# Patient Record
Sex: Female | Born: 1995 | Race: White | Hispanic: No | Marital: Single | Smoking: Never smoker
Health system: Southern US, Community
[De-identification: ages and names within clinical notes are randomized; demographics above are authoritative.]

## PROBLEM LIST (undated history)

## (undated) DIAGNOSIS — M5430 Sciatica, unspecified side: Secondary | ICD-10-CM

## (undated) DIAGNOSIS — M549 Dorsalgia, unspecified: Secondary | ICD-10-CM

## (undated) HISTORY — PX: WISDOM TOOTH EXTRACTION: SHX21

---
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
## 2016-04-12 ENCOUNTER — Encounter: Payer: Self-pay | Admitting: *Deleted

## 2016-04-12 ENCOUNTER — Emergency Department
Admission: EM | Admit: 2016-04-12 | Discharge: 2016-04-13 | Disposition: A | Discharge status: Home / Self Care | Payer: Managed Care, Other (non HMO) | Attending: Emergency Medicine | Admitting: Emergency Medicine

## 2016-04-12 DIAGNOSIS — M545 Low back pain, unspecified: Secondary | ICD-10-CM

## 2016-04-12 DIAGNOSIS — Z79899 Other long term (current) drug therapy: Secondary | ICD-10-CM | POA: Insufficient documentation

## 2016-04-12 HISTORY — DX: Dorsalgia, unspecified: M54.9

## 2016-04-12 HISTORY — DX: Sciatica, unspecified side: M54.30

## 2016-04-12 NOTE — ED Notes (Signed)
Pt presents to treatment room via wheelchair with c/o pain upper right gluteal region. Pt states was ambulating when felt sudden sharp pain to right glutea and states "made me fall to the floor." Pt reports did not fall hard, tender to touch. Pt has hx of sciatica but states pain is not the same.

## 2016-04-12 NOTE — ED Triage Notes (Signed)
Pt c/o R lower back pain x 2 hrs. Pt states she has hx of sciatica and [facility]. Pt states took ibuprofen 800 mg x 2 hrs ago w/o relief. Pt states she needs something to help her walk w/o pain. Pt states she feels it is a nerve. Pt has not yet taken her gabapentin.

## 2016-04-13 MED ORDER — KETOROLAC TROMETHAMINE 60 MG/2ML IM SOLN
60.0000 mg | Freq: Once | INTRAMUSCULAR | Status: AC
  Administered 2016-04-13: 60 mg via INTRAMUSCULAR
  Filled 2016-04-13: qty 2

## 2016-04-13 MED ORDER — MELOXICAM 15 MG PO TABS: 15.0000 mg | ORAL_TABLET | Freq: Every day | ORAL | 0 refills | Status: AC

## 2016-04-13 MED ORDER — ORPHENADRINE CITRATE 30 MG/ML IJ SOLN
60.0000 mg | Freq: Two times a day (BID) | INTRAMUSCULAR | Status: DC
  Administered 2016-04-13: 60 mg via INTRAMUSCULAR
  Filled 2016-04-13: qty 2

## 2016-04-13 MED ORDER — TRAMADOL HCL 50 MG PO TABS
50.0000 mg | ORAL_TABLET | Freq: Once | ORAL | Status: AC
  Administered 2016-04-13: 50 mg via ORAL
  Filled 2016-04-13: qty 1

## 2016-04-13 MED ORDER — CYCLOBENZAPRINE HCL 5 MG PO TABS: 5.0000 mg | ORAL_TABLET | Freq: Three times a day (TID) | ORAL | 0 refills | Status: AC | PRN

## 2016-04-13 NOTE — ED Provider Notes (Signed)
[facility] Emergency Department Provider Note  ____________________________________________  Time seen: Approximately 12:26 AM  I have reviewed the triage vital signs and the nursing notes.   HISTORY  Chief Complaint Back Pain    HPI Jill Merritt is a 20 y.o. female that presents to the emergency department with one day of low right back pain. Patient says pain is sharp and is localized to the low right back. Patient has no pain in legs. Patient has no pain over vertebrae. Patient denies numbness or tingling in legs, fingers or toes. Patient is having difficulty walking due to pain. Patient denies urinary incontinence. 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 Patient was instructed from neurologist to return to the emergency department if she had urinary incontinence or could not stand on tiptoes. Patient has chronic back pain and sciatica.    Past Medical History:  Diagnosis Date  . Back pain   . Sciatica     There are no active problems to display for this patient.   History reviewed. No pertinent surgical history.  Prior to Admission medications   Medication Sig Start Date End Date Taking? Authorizing Provider  gabapentin (NEURONTIN) 300 MG capsule Take 300 mg by mouth 3 (three) times daily.   Yes Historical Provider, MD  lisdexamfetamine (VYVANSE) 20 MG capsule Take 20 mg by mouth daily.   Yes Historical Provider, MD  cyclobenzaprine (FLEXERIL) 5 MG tablet Take 1 tablet (5 mg total) by mouth 3 (three) times daily as needed for muscle spasms. 04/13/16 04/20/16  Enid DerryAshley Cira Deyoe, PA-C  meloxicam (MOBIC) 15 MG tablet Take 1 tablet (15 mg total) by mouth daily. 04/13/16 04/23/16  Enid DerryAshley Madalyn Legner, PA-C    Allergies Patient has no known allergies.  History reviewed. No pertinent family history.  Social History Social History  Substance Use Topics  . Smoking status: Never Smoker  . Smokeless tobacco: Never Used   . Alcohol use Yes     Review of Systems  Constitutional: No fever/chills Eyes: No visual changes.  ENT: No upper respiratory complaints. Cardiovascular: no chest pain. Respiratory: no cough. No SOB. Gastrointestinal: No abdominal pain.  No nausea, no vomiting.  No diarrhea.  No constipation. Genitourinary: Negative for dysuria. No hematuria Skin: Negative for rash, abrasions, lacerations, ecchymosis. Neurological: Negative for headaches, focal weakness or numbness.  ____________________________________________   PHYSICAL EXAM:  VITAL SIGNS: ED Triage Vitals  Enc Vitals Group     BP 04/12/16 2217 (!) 143/71     Pulse Rate 04/12/16 2217 75     Resp 04/12/16 2217 18     Temp 04/12/16 2217 98.4 F (36.9 C)     Temp Source 04/12/16 2217 Oral     SpO2 04/12/16 2217 100 %     Weight 04/12/16 2217 175 lb (79.4 kg)     Height 04/12/16 2217 5\' 8"  (1.727 m)     Head Circumference --      Peak Flow --      Pain Score 04/12/16 2225 6     Pain Loc --      Pain Edu? --      Excl. in GC? --      Constitutional: Alert and oriented. Well appearing and in no acute distress. Eyes: Conjunctivae are normal. PERRL. EOMI. Head: Atraumatic. ENT:      Ears:       Nose: No congestion/rhinnorhea.      Mouth/Throat: Mucous membranes are moist.  Neck: No  stridor.   Cardiovascular: Normal rate, regular rhythm. Normal S1 and S2.  Good peripheral circulation. Respiratory: Normal respiratory effort without tachypnea or retractions. Lungs CTAB. Good air entry to the bases with no decreased or absent breath sounds. Gastrointestinal: Bowel sounds 4 quadrants. Soft and nontender to palpation. No guarding or rigidity. No palpable masses. No distention. No CVA tenderness. Musculoskeletal: Full range of motion to all extremities. No gross deformities appreciated. Patient able to stand on tiptoes. Patient able to walk with pain over her right lower back. Tenderness over  right SI joint. Neurologic:  Normal speech and language. No gross focal neurologic deficits are appreciated.  Cranial nerves: 2-10 normal as tested. Strength 5/5 in upper and lower extremities Cerebellar: Finger-nose-finger WNL, Heel to shin WNL Sensorimotor: No pronator drift, clonus, sensory loss or abnormal reflexes. No vision deficits noted to confrontation bilaterally.  Speech: No dysarthria or expressive aphasia Skin:  Skin is warm, dry and intact. No rash noted. Psychiatric: Mood and affect are normal. Speech and behavior are normal. Patient exhibits appropriate insight and judgement.   ____________________________________________   LABS (all labs ordered are listed, but only abnormal results are displayed)  Labs Reviewed - No data to display ____________________________________________  EKG   ____________________________________________  RADIOLOGY  No results found.  ____________________________________________    PROCEDURES  Procedure(s) performed:    Procedures    Medications  orphenadrine (NORFLEX) injection 60 mg (not administered)  ketorolac (TORADOL) injection 60 mg (not administered)  traMADol (ULTRAM) tablet 50 mg (not administered)     ____________________________________________   INITIAL IMPRESSION / ASSESSMENT AND PLAN / ED COURSE  Pertinent labs & imaging results that were available during my care of the patient were reviewed by me and considered in my medical decision making (see chart for details).  Review of the [place] CSRS was performed in accordance of the [facility] prior to dispensing any controlled drugs.  Clinical Course     Patient presents to the emergency department with low right back pain for one day. Patient has a history of epidermal lipomatosis. Patient was given Norflex, tramadol, Toradol in ED. No evidence of cauda equina, cauda hematoma, vascular catastrophe, spinal abscess/hematoma, or referred intra-abdominal pain. All patients questions questions were  answered.  Patient will be discharged home with prescriptions for Flexeril and meloxicam. Patient is to follow up with neurology in the morning. Patient is given ED precautions to return to the ED for any worsening or new symptoms.     ____________________________________________  FINAL CLINICAL IMPRESSION(S) / ED DIAGNOSES  Final diagnoses:  Acute right-sided low back pain without sciatica      NEW MEDICATIONS STARTED DURING THIS VISIT:  New Prescriptions   CYCLOBENZAPRINE (FLEXERIL) 5 MG TABLET    Take 1 tablet (5 mg total) by mouth 3 (three) times daily as needed for muscle spasms.   MELOXICAM (MOBIC) 15 MG TABLET    Take 1 tablet (15 mg total) by mouth daily.        This chart was dictated using voice recognition software/Dragon. Despite best efforts to proofread, errors can occur which can change the meaning. Any change was purely unintentional.   Enid DerryAshley Aaden Buckman, PA-C 04/13/16 0033    Rockne MenghiniAnne-Caroline Norman, MD 04/14/16 2119

## 2016-04-13 NOTE — ED Notes (Signed)

## 2016-04-13 NOTE — ED Notes (Signed)
.  [email or web address]

## 2016-04-13 NOTE — ED Notes (Signed)
Pt received instructions on how to ambulate with crutches. Pt states that they have prior knowledge from using crutches in the past. Pt verbalized understanding of instructions and had no further questions. Rn notified

## 2017-03-07 ENCOUNTER — Ambulatory Visit
Admission: EM | Admit: 2017-03-07 | Discharge: 2017-03-07 | Disposition: A | Discharge status: Home / Self Care | Payer: 59 | Source: Ambulatory Visit | Attending: Family Medicine | Admitting: Family Medicine

## 2017-03-07 ENCOUNTER — Ambulatory Visit
Admission: RE | Admit: 2017-03-07 | Discharge: 2017-03-07 | Disposition: A | Discharge status: Home / Self Care | Payer: 59 | Source: Ambulatory Visit | Attending: Family Medicine | Admitting: Family Medicine

## 2017-03-07 ENCOUNTER — Other Ambulatory Visit: Payer: Self-pay | Admitting: Family Medicine

## 2017-03-07 DIAGNOSIS — R05 Cough: Secondary | ICD-10-CM

## 2017-03-07 DIAGNOSIS — R059 Cough, unspecified: Secondary | ICD-10-CM

## 2017-06-18 ENCOUNTER — Encounter (HOSPITAL_COMMUNITY): Payer: Self-pay | Admitting: *Deleted

## 2017-06-18 ENCOUNTER — Emergency Department (HOSPITAL_COMMUNITY)
Admission: EM | Admit: 2017-06-18 | Discharge: 2017-06-18 | Disposition: A | Discharge status: Home / Self Care | Payer: 59 | Attending: Emergency Medicine | Admitting: Emergency Medicine

## 2017-06-18 ENCOUNTER — Emergency Department (HOSPITAL_COMMUNITY): Payer: 59

## 2017-06-18 DIAGNOSIS — X501XXA Overexertion from prolonged static or awkward postures, initial encounter: Secondary | ICD-10-CM | POA: Insufficient documentation

## 2017-06-18 DIAGNOSIS — Y999 Unspecified external cause status: Secondary | ICD-10-CM | POA: Diagnosis not present

## 2017-06-18 DIAGNOSIS — Y939 Activity, unspecified: Secondary | ICD-10-CM | POA: Insufficient documentation

## 2017-06-18 DIAGNOSIS — S99911A Unspecified injury of right ankle, initial encounter: Secondary | ICD-10-CM | POA: Diagnosis present

## 2017-06-18 DIAGNOSIS — Z79899 Other long term (current) drug therapy: Secondary | ICD-10-CM | POA: Insufficient documentation

## 2017-06-18 DIAGNOSIS — S93401A Sprain of unspecified ligament of right ankle, initial encounter: Secondary | ICD-10-CM | POA: Insufficient documentation

## 2017-06-18 DIAGNOSIS — Y929 Unspecified place or not applicable: Secondary | ICD-10-CM | POA: Diagnosis not present

## 2017-06-18 NOTE — ED Triage Notes (Signed)
Pt reports on week ago that she rolled her right ankle and she heard a "crunchy" noise. Pt last took advil around 1700 for the pain. Pt ambulatory with steady gait to triage, noted right lateral ankle swelling.

## 2017-06-20 NOTE — ED Provider Notes (Signed)
[facility]-EMERGENCY DEPT Provider Note   CSN: [id number] Arrival date & time: 06/18/17  1921     History   Chief Complaint Chief Complaint  Patient presents with  . Ankle Pain    HPI Jill Merritt is a 22 y.o. female.  HPI   22 year old female with right ankle pain.  She states that about a week ago she was wearing high heels and stepped awkwardly.  Sounds like she had an inversion injury.  She reports that she heard a crunch."  She had persistent pain since then although somewhat improved.  She can bear weight although with increased pain.  No numbness or tingling.  Past Medical History:  Diagnosis Date  . Back pain   . Sciatica     There are no active problems to display for this patient.   Past Surgical History:  Procedure Laterality Date  . WISDOM TOOTH EXTRACTION      OB History    No data available       Home Medications    Prior to Admission medications   Medication Sig Start Date End Date Taking? Authorizing Provider  gabapentin (NEURONTIN) 300 MG capsule Take 300 mg by mouth 3 (three) times daily.    [provider]  lisdexamfetamine (VYVANSE) 20 MG capsule Take 20 mg by mouth daily.    [provider]    Family History No family history on file.  Social History Social History   Tobacco Use  . Smoking status: Never Smoker  . Smokeless tobacco: Never Used  Substance Use Topics  . Alcohol use: Yes  . Drug use: No     Allergies   Patient has no known allergies.   Review of Systems Review of Systems   Physical Exam Updated Vital Signs BP 140/78   Pulse 84   Temp (!) 97.1 F (36.2 C) (Oral)   Resp 18   Ht 5\' 8"  (1.727 m)   Wt 81.6 kg (180 lb)   LMP 05/18/2017   SpO2 100%   BMI 27.37 kg/m   Physical Exam  Constitutional: She appears well-developed and well-nourished. No distress.  HENT:  Head: Normocephalic and atraumatic.  Eyes: Conjunctivae are normal. Right eye exhibits no  discharge. Left eye exhibits no discharge.  Neck: Neck supple.  Cardiovascular: Normal rate, regular rhythm and normal heart sounds. Exam reveals no gallop and no friction rub.  No murmur heard. Pulmonary/Chest: Effort normal and breath sounds normal. No respiratory distress.  Abdominal: Soft. She exhibits no distension. There is no tenderness.  Musculoskeletal: She exhibits no edema or tenderness.  Mild tenderness and mild swelling over the lateral malleolus of the right ankle.  Neurovascularly intact.  She can actively range with no apparent discomfort.  Neurological: She is alert.  Skin: Skin is warm and dry.  Psychiatric: She has a normal mood and affect. Her behavior is normal. Thought content normal.  Nursing note and vitals reviewed.    ED Treatments / Results  Labs (all labs ordered are listed, but only abnormal results are displayed) Labs Reviewed - No data to display  EKG  EKG Interpretation None       Radiology Dg Ankle Complete Right  Result Date: 06/18/2017 CLINICAL DATA:  Ankle injury 1 week ago. Lateral right ankle swelling. EXAM: RIGHT ANKLE - COMPLETE 3+ VIEW COMPARISON:  None. FINDINGS: Mild soft tissue swelling is present over the lateral malleolus. There is no underlying fracture. Ankle joint is located. Hindfoot is within normal limits. IMPRESSION:  Mild soft tissue swelling over the lateral malleolus without underlying fracture. Electronically Signed   By: Marin Robertshristopher  Mattern M.D.   On: 06/18/2017 21:02    Procedures Procedures (including critical care time)  Medications Ordered in ED Medications - No data to display   Initial Impression / Assessment and Plan / ED Course  I have reviewed the triage vital signs and the nursing notes.  Pertinent labs & imaging results that were available during my care of the patient were reviewed by me and considered in my medical decision making (see chart for details).     Ankle sprain.  Negative imaging.   Neurovascular intact.  Rice.  NSAIDs.  Final Clinical Impressions(s) / ED Diagnoses   Final diagnoses:  Sprain of right ankle, unspecified ligament, initial encounter    ED Discharge Orders    None       Raeford RazorKohut, Pennelope Basque, MD 06/20/17 [phone]

## 2019-08-24 IMAGING — CR DG ANKLE COMPLETE 3+V*R*
3 series · 3 of 3 positions shown · non-contrast
Comparison: None.

CLINICAL DATA: Ankle injury 1 week ago. Lateral right ankle
swelling.

EXAM:
RIGHT ANKLE - COMPLETE 3+ VIEW

[x ankle ap right]
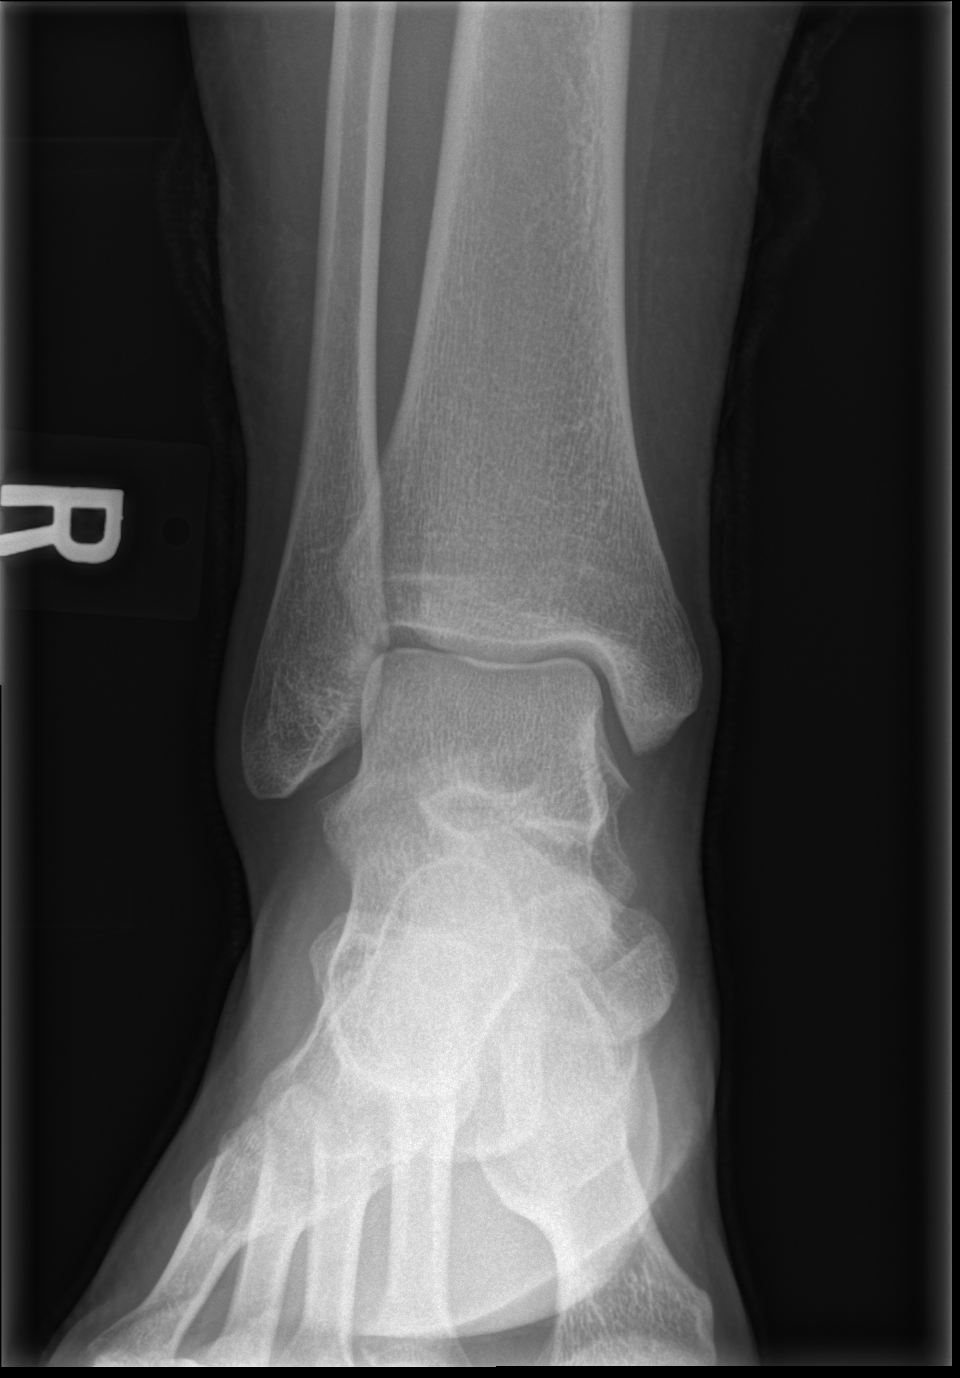

[x ankle obl right]
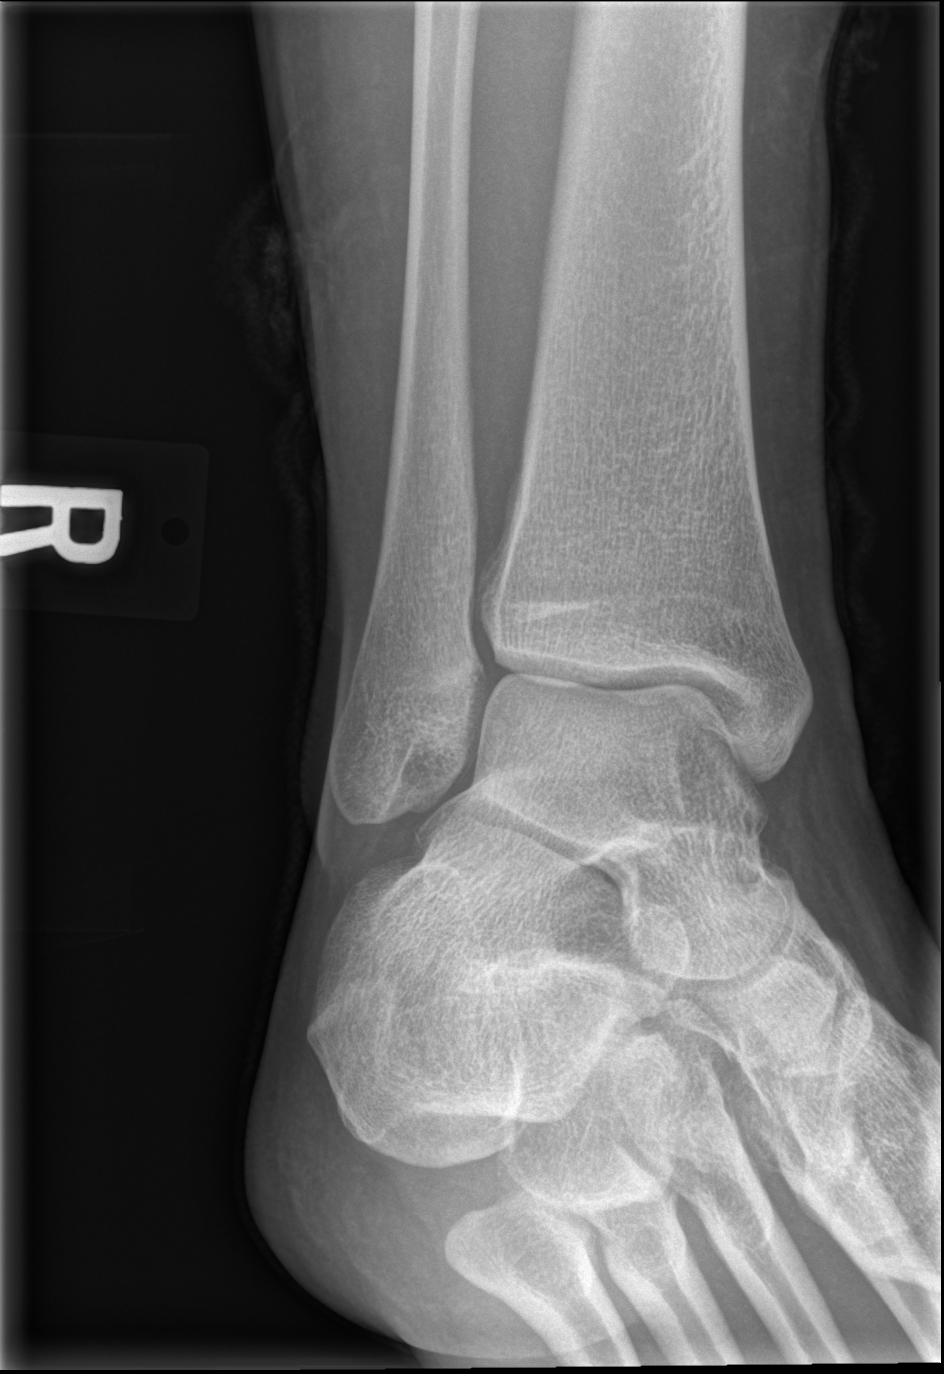

[x ankle lat right]
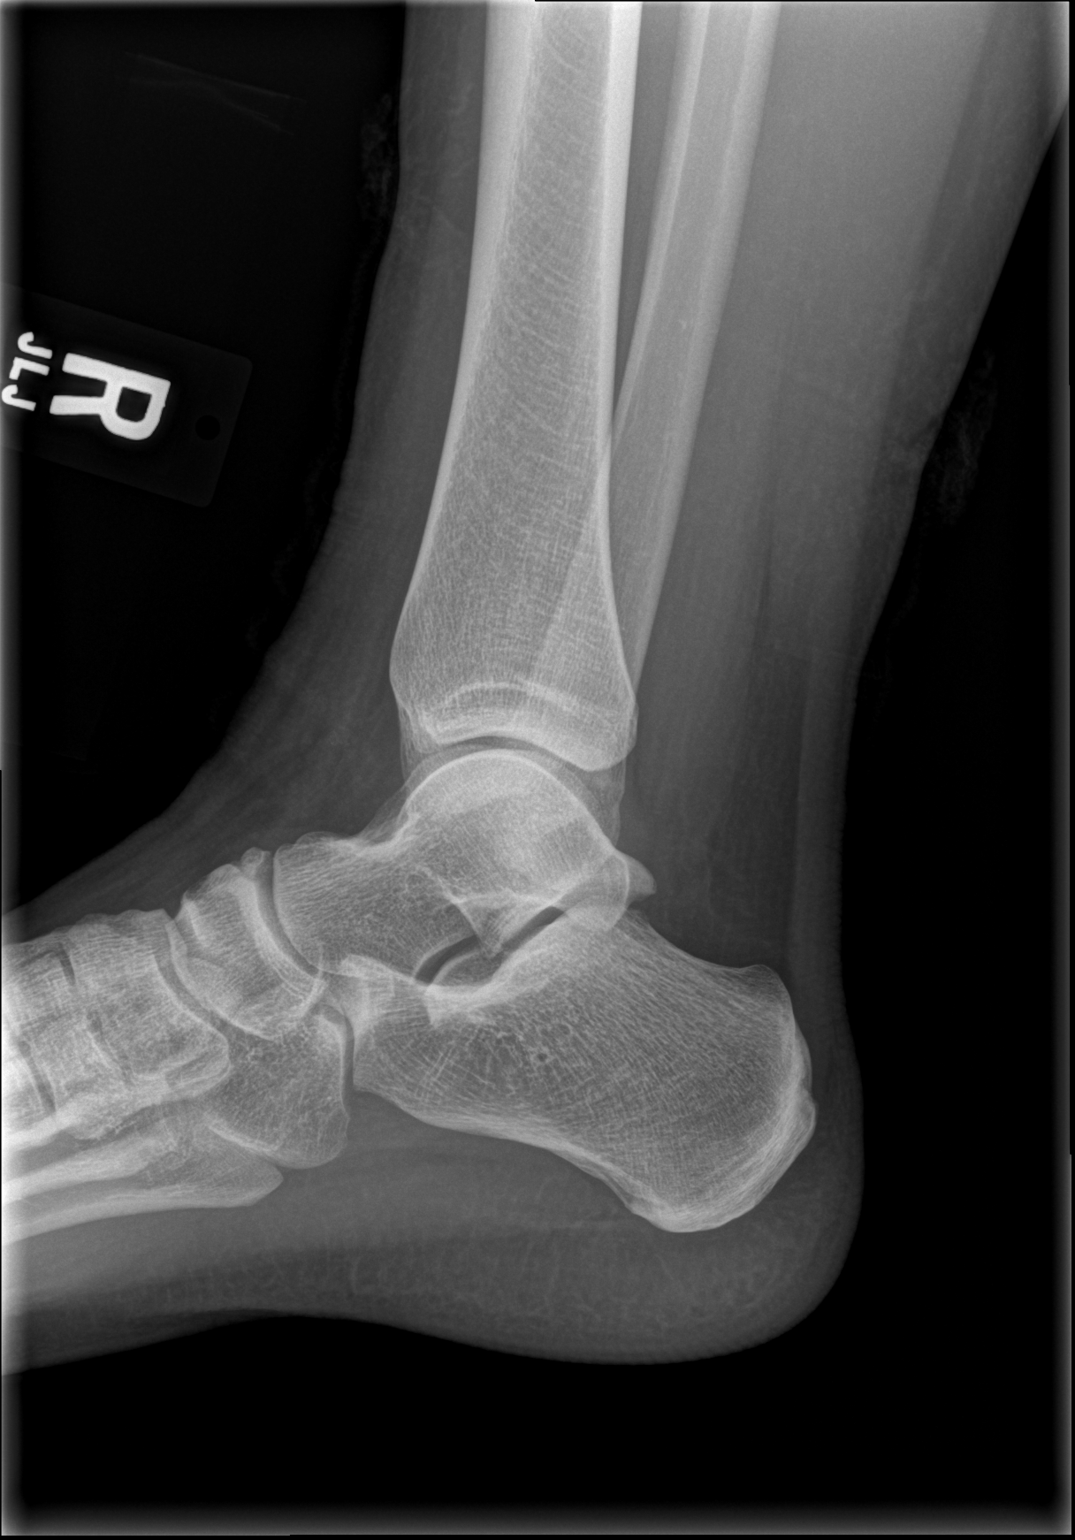

[3 of 3 positions shown; findings below may reference images not displayed]

FINDINGS: Mild soft tissue swelling is present over the lateral malleolus.
There is no underlying fracture. Ankle joint is located. Hindfoot is
within normal limits.
IMPRESSION: Mild soft tissue swelling over the lateral malleolus without
underlying fracture.
